# Patient Record
Sex: Female | Born: 1965 | Race: Black or African American | Hispanic: No | Smoking: Never smoker
Health system: Southern US, Community
[De-identification: ages and names within clinical notes are randomized; demographics above are authoritative.]

## PROBLEM LIST (undated history)

## (undated) DIAGNOSIS — I10 Essential (primary) hypertension: Secondary | ICD-10-CM

## (undated) HISTORY — PX: DILATION AND CURETTAGE OF UTERUS: SHX78

---

## 1997-11-06 ENCOUNTER — Other Ambulatory Visit: Admission: RE | Admit: 1997-11-06 | Discharge: 1997-11-06 | Payer: Self-pay | Admitting: Family Medicine

## 2005-06-15 ENCOUNTER — Ambulatory Visit: Payer: Self-pay | Admitting: Gastroenterology

## 2010-06-04 ENCOUNTER — Ambulatory Visit: Payer: Self-pay | Admitting: Gastroenterology

## 2010-06-17 ENCOUNTER — Ambulatory Visit (HOSPITAL_COMMUNITY)
Admission: RE | Admit: 2010-06-17 | Discharge: 2010-06-17 | Payer: Self-pay | Source: Home / Self Care | Attending: Gastroenterology | Admitting: Gastroenterology

## 2010-06-22 LAB — CBC
HCT: 35.1 % — ABNORMAL LOW (ref 36.0–46.0)
Hemoglobin: 11.8 g/dL — ABNORMAL LOW (ref 12.0–15.0)
MCH: 29.3 pg (ref 26.0–34.0)
MCHC: 33.6 g/dL (ref 30.0–36.0)
MCV: 87.1 fL (ref 78.0–100.0)
Platelets: 208 10*3/uL (ref 150–400)
RBC: 4.03 MIL/uL (ref 3.87–5.11)
RDW: 12.4 % (ref 11.5–15.5)
WBC: 6.4 10*3/uL (ref 4.0–10.5)

## 2010-06-22 LAB — PROTIME-INR
INR: 0.98 (ref 0.00–1.49)
Prothrombin Time: 13.2 seconds (ref 11.6–15.2)

## 2010-06-22 LAB — APTT: aPTT: 31 seconds (ref 24–37)

## 2012-01-09 IMAGING — US US BIOPSY
1 series · 14 of 19 positions shown · non-contrast
Comparison: none

Clinical: Hepatitis C; request is made for random core liver
biopsy.

ULTRASOUND GUIDED RANDOM CORE LIVER BIOPSY
Sedation:  2 mg IV Versed; 75 mcg IV Fentanyl
Total Moderate Sedation Time:  20 minutes.
An ultrasound guided liver biopsy was thoroughly discussed with the
patient and questions were answered.  The benefits, risks,
alternatives, and complications were also discussed.  The patient
understands and wishes to proceed with the procedure.  Written
consent was obtained.
Ultrasound of the liver was performed and an appropriate skin entry
site was determined.  Skin site was marked, prepped with Betadine,
and draped in the usual sterile fashion.  Local anesthesia was
provided with 1% Lidocaine.
A 17 gauge trocar needle was advanced under ultrasound guidance
into the liver (right hepatic lobe).  A total of 3 coaxial 18 gauge
core samples were then obtained through the guide needle. The guide
needle was removed. Post procedure scans were obtained.
Complications:  none

[Series 1: us biopsy · 0.28mm/px · 14 of 19 slices shown]
[im 1/19]
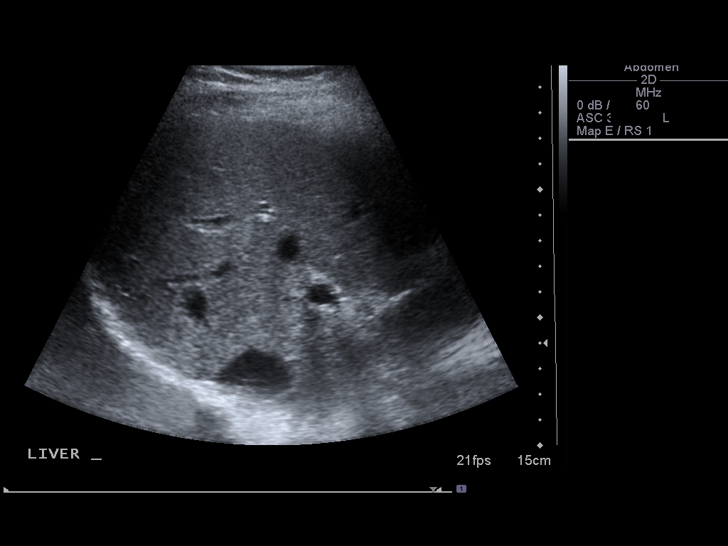
[im 3/19]
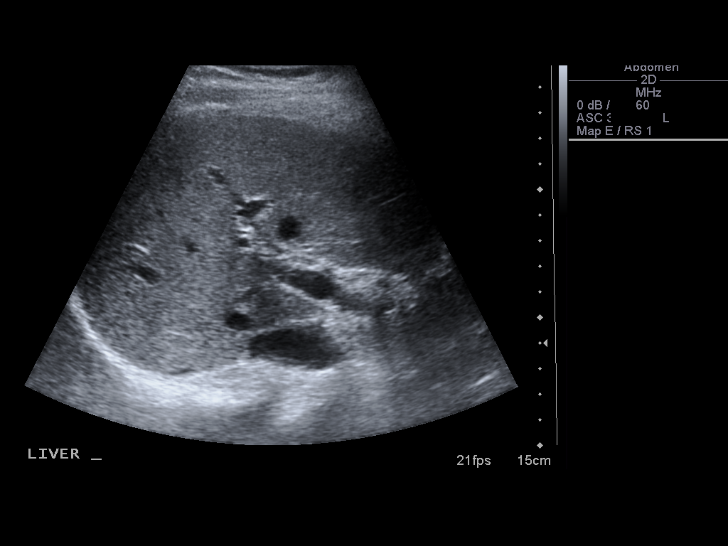
[im 4/19]
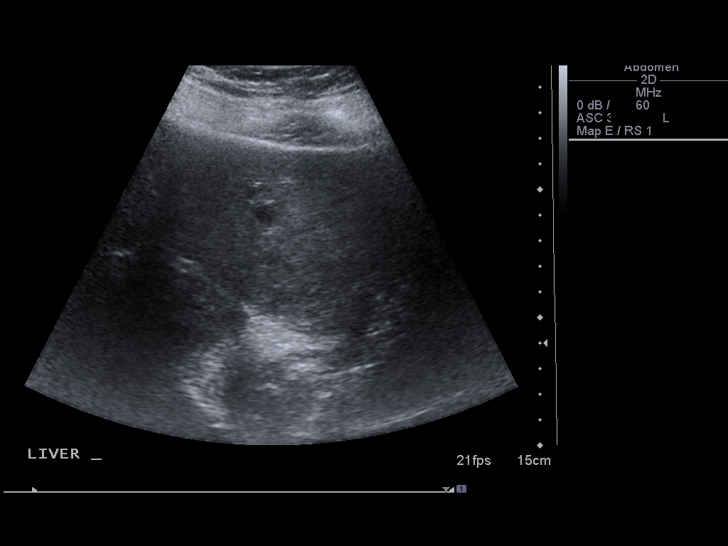
[im 5/19]
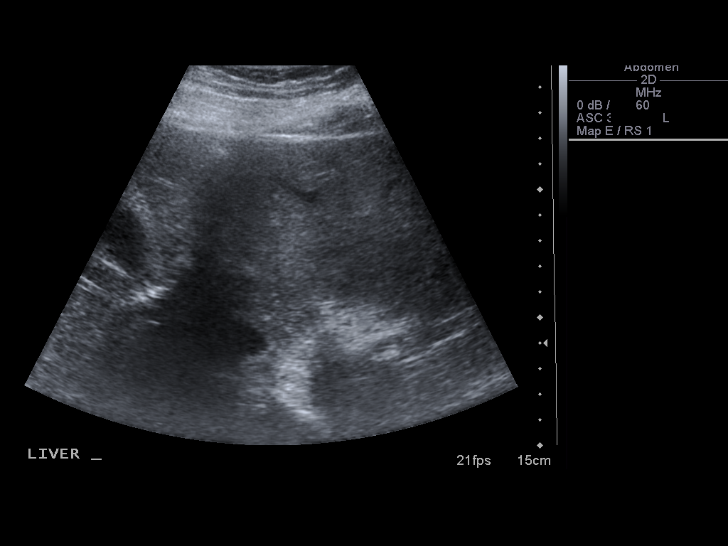
[im 7/19]
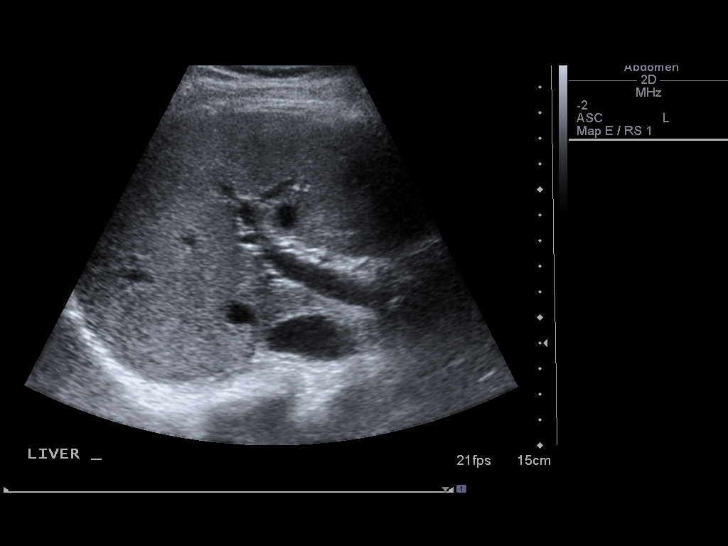
[im 8/19]
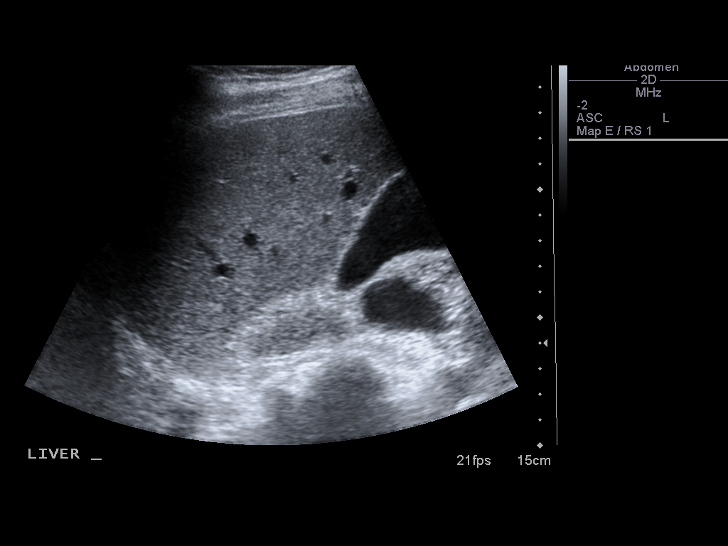
[im 9/19]
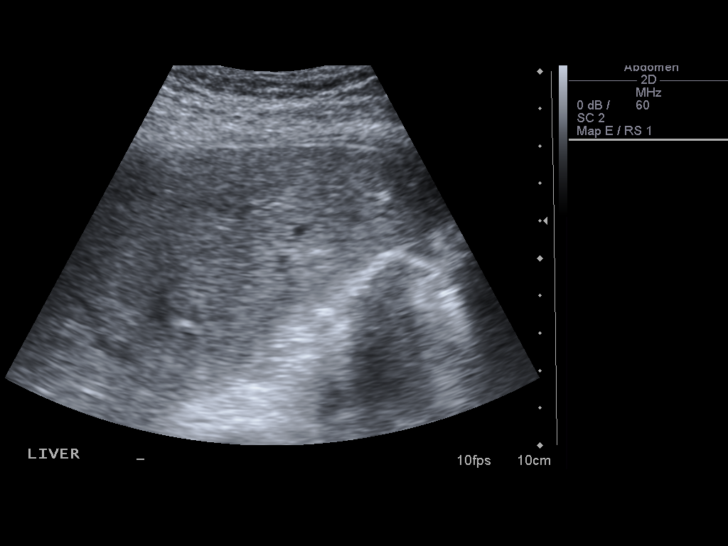
[im 11/19]
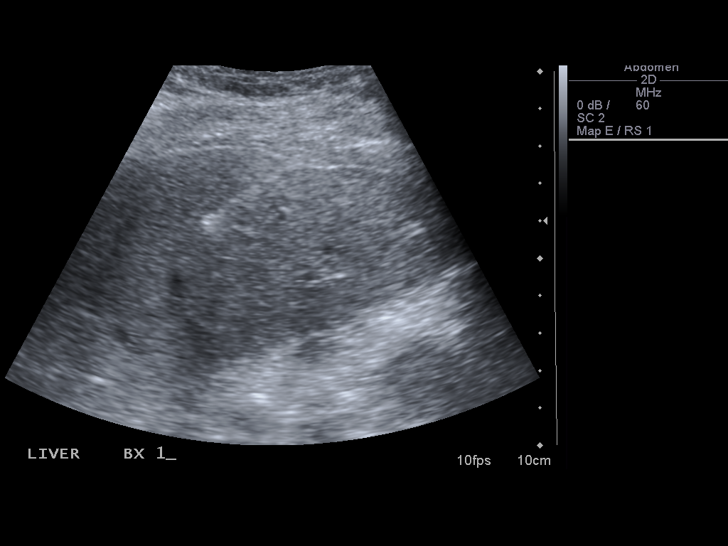
[im 12/19]
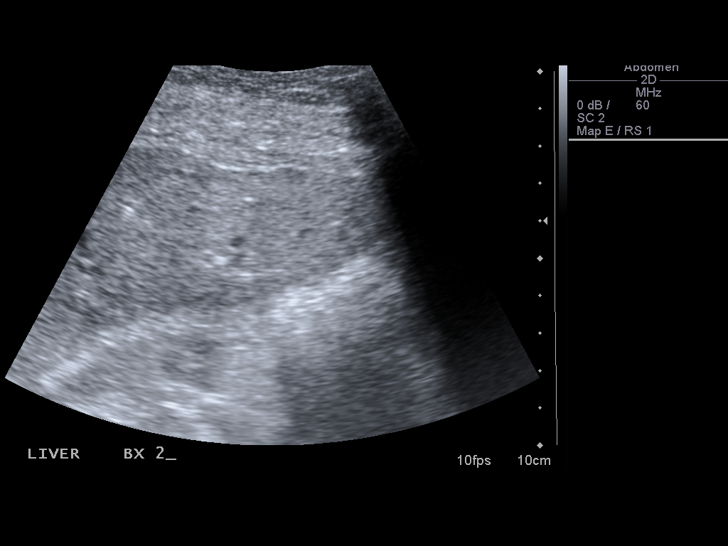
[im 13/19]
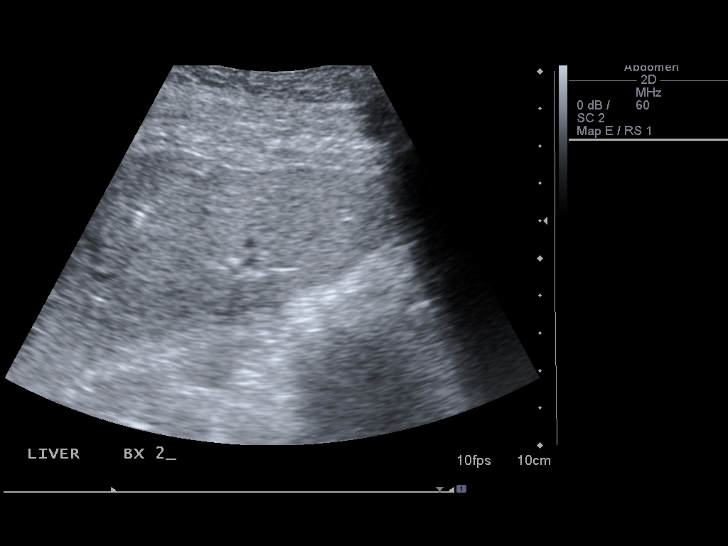
[im 15/19]
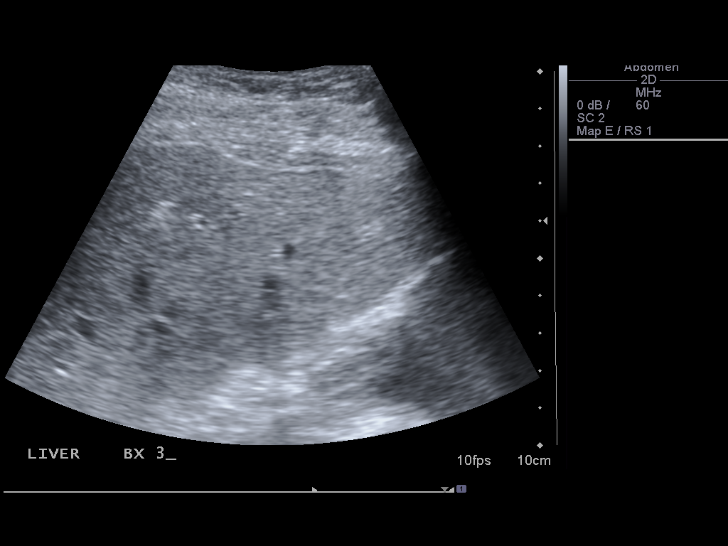
[im 16/19]
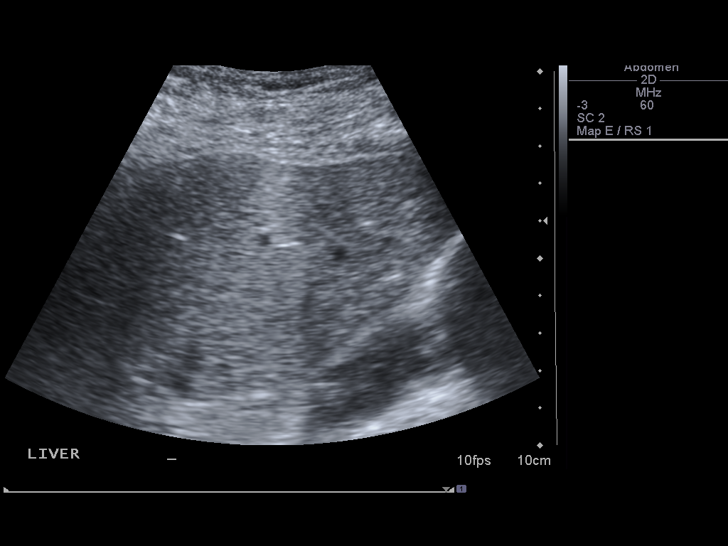
[im 17/19]
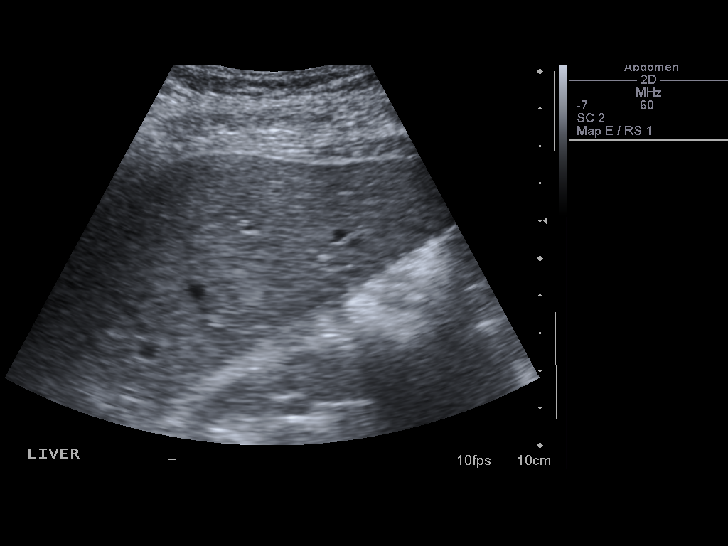
[im 19/19]
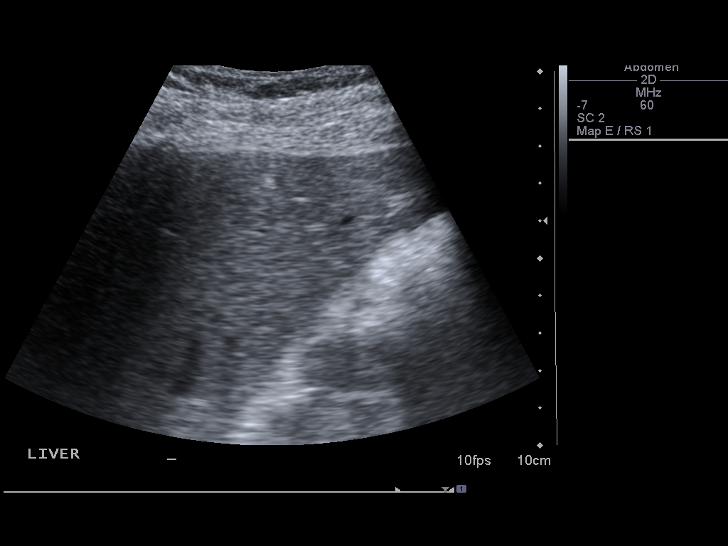

[14 of 19 positions shown; findings below may reference images not displayed]

IMPRESSION: Successful ultrasound guided random core biopsy of the liver.
Final pathology pending.

Read by: Locklear, Ry.-SAVIDAN

## 2018-04-10 ENCOUNTER — Other Ambulatory Visit: Payer: Self-pay | Admitting: Rehabilitation

## 2018-04-10 DIAGNOSIS — M5416 Radiculopathy, lumbar region: Secondary | ICD-10-CM

## 2018-04-25 ENCOUNTER — Ambulatory Visit
Admission: RE | Admit: 2018-04-25 | Discharge: 2018-04-25 | Disposition: A | Discharge status: Home / Self Care | Payer: Medicaid Other | Source: Ambulatory Visit | Attending: Rehabilitation | Admitting: Rehabilitation

## 2018-04-25 DIAGNOSIS — M5416 Radiculopathy, lumbar region: Secondary | ICD-10-CM

## 2018-05-10 ENCOUNTER — Emergency Department (HOSPITAL_COMMUNITY)
Admission: EM | Admit: 2018-05-10 | Discharge: 2018-05-11 | Disposition: A | Discharge status: Home / Self Care | Payer: Medicaid Other | Attending: Emergency Medicine | Admitting: Emergency Medicine

## 2018-05-10 ENCOUNTER — Encounter (HOSPITAL_COMMUNITY): Payer: Self-pay

## 2018-05-10 DIAGNOSIS — H538 Other visual disturbances: Secondary | ICD-10-CM | POA: Diagnosis not present

## 2018-05-10 DIAGNOSIS — R110 Nausea: Secondary | ICD-10-CM | POA: Diagnosis not present

## 2018-05-10 DIAGNOSIS — I10 Essential (primary) hypertension: Secondary | ICD-10-CM | POA: Diagnosis not present

## 2018-05-10 DIAGNOSIS — G44209 Tension-type headache, unspecified, not intractable: Secondary | ICD-10-CM | POA: Diagnosis not present

## 2018-05-10 DIAGNOSIS — R51 Headache: Secondary | ICD-10-CM | POA: Diagnosis present

## 2018-05-10 HISTORY — DX: Essential (primary) hypertension: I10

## 2018-05-10 NOTE — ED Triage Notes (Signed)
Pt states that she has been off BP meds for the past three years, went and saw PCP yesterday and put on amlodipine 5mg , today BP 170 systolic and having a headache.

## 2018-05-11 MED ORDER — DEXAMETHASONE SODIUM PHOSPHATE 10 MG/ML IJ SOLN
10.0000 mg | Freq: Once | INTRAMUSCULAR | Status: AC
  Administered 2018-05-11: 10 mg via INTRAVENOUS
  Filled 2018-05-11: qty 1

## 2018-05-11 MED ORDER — METOCLOPRAMIDE HCL 5 MG/ML IJ SOLN
10.0000 mg | Freq: Once | INTRAMUSCULAR | Status: AC
  Administered 2018-05-11: 10 mg via INTRAVENOUS
  Filled 2018-05-11: qty 2

## 2018-05-11 MED ORDER — LORAZEPAM 2 MG/ML IJ SOLN
1.0000 mg | Freq: Once | INTRAMUSCULAR | Status: AC
  Administered 2018-05-11: 1 mg via INTRAVENOUS
  Filled 2018-05-11: qty 1

## 2018-05-11 MED ORDER — SODIUM CHLORIDE 0.9 % IV BOLUS
1000.0000 mL | Freq: Once | INTRAVENOUS | Status: AC
  Administered 2018-05-11: 1000 mL via INTRAVENOUS

## 2018-05-11 MED ORDER — DIPHENHYDRAMINE HCL 50 MG/ML IJ SOLN
25.0000 mg | Freq: Once | INTRAMUSCULAR | Status: AC
  Administered 2018-05-11: 25 mg via INTRAVENOUS
  Filled 2018-05-11: qty 1

## 2018-05-11 NOTE — Discharge Instructions (Addendum)
Please check your blood pressure once a day and keep a record of it.  Take that record with you when you see your primary care provider.  Please do not get overly concerned about an individual blood pressure reading which is high.  If you are concerned about it, recheck the blood pressure in about 30 minutes.

## 2018-05-11 NOTE — ED Notes (Signed)
Vss, pt verbalized understanding of dc instructions, patient escorted to vehicle via wheelchair, son with patient to drive her home

## 2018-05-11 NOTE — ED Provider Notes (Signed)
[facility] Provider Note   CSN: [id number] Arrival date & time: 05/10/18  2258     History   Chief Complaint Chief Complaint  Patient presents with  . Hypertension    HPI Suzanne Hood is a 52 y.o. female.  The history is provided by the patient.  She has a history of hypertension and comes in because of elevated blood pressure.  She has been off of medication for several years, was seen at her doctor's office yesterday and noted the blood pressure was elevated and started back on amlodipine 5 mg daily.  She has also had a right-sided headache for about the last week.  Headache is dull and throbbing and she rates it at 10/10.  There is associated blurring of vision and nausea but no vomiting.  Headache is worse with bright light, loud noise and also with yawning.  She is taken acetaminophen with little relief.  Tonight, she took her blood pressure at home and it was 170/100 and she was not able to get it down.  She did talk with her PCP who recommended she take an extra dose of the amlodipine, which she did, without any improvement in her blood pressure.  Past Medical History:  Diagnosis Date  . Hypertension     There are no active problems to display for this patient.   ** The histories are not reviewed yet. Please review them in the "History" navigator section and refresh this SmartLink.   OB History   None      Home Medications    Prior to Admission medications   Not on File    Family History No family history on file.  Social History Social History   Tobacco Use  . Smoking status: Not on file  Substance Use Topics  . Alcohol use: Not on file  . Drug use: Not on file     Allergies   Flagyl [metronidazole]; Penicillins; and Sulfa antibiotics   Review of Systems Review of Systems  All other systems reviewed and are negative.    Physical Exam Updated Vital Signs BP (!) 145/98 (BP Location: Right Arm)    Pulse 69   Temp 97.8 F (36.6 C) (Oral)   Resp 19   SpO2 99%   Physical Exam  Nursing note and vitals reviewed.  52 year old female, resting comfortably and in no acute distress. Vital signs are significant for elevated blood pressure. Oxygen saturation is 99%, which is normal. Head is normocephalic and atraumatic. PERRLA, EOMI. Oropharynx is clear. Neck is nontender and supple without adenopathy or JVD.  Fundi show no hemorrhage, exudate, or papilledema.  There is tenderness to palpation over the right temporalis muscle and over the insertion of the paracervical muscles on the right. Back is nontender and there is no CVA tenderness. Lungs are clear without rales, wheezes, or rhonchi. Chest is nontender. Heart has regular rate and rhythm without murmur. Abdomen is soft, flat, nontender without masses or hepatosplenomegaly and peristalsis is normoactive. Extremities have no cyanosis or edema, full range of motion is present. Skin is warm and dry without rash. Neurologic: Mental status is normal, cranial nerves are intact, there are no motor or sensory deficits.  ED Treatments / Results   EKG EKG Interpretation  Date/Time:  Thursday May 11 2018 00:05:24 EST Ventricular Rate:  75 PR Interval:  166 QRS Duration: 86 QT Interval:  376 QTC Calculation: 419 R Axis:   19 Text Interpretation:  Normal sinus rhythm  Normal ECG No old tracing to compare Confirmed by Dione Booze ([id number]) on 05/11/2018 12:29:19 AM  Procedures Procedures (including critical care time)  Medications Ordered in ED Medications - No data to display   Initial Impression / Assessment and Plan / ED Course  I have reviewed the triage vital signs and the nursing notes.  Elevated blood pressure and patient was recently started back on antihypertensive medication.  She has not given this an adequate amount of time to assess blood pressure response, but blood pressure is not at a critical level in the ED.   Headache which has characteristics and physical finding strongly suggestive of muscle contraction headache.  No red flags to suggest more serious causes of headache.  Old records are reviewed, and she has no relevant past visits.  I have reassured her that her elevated blood pressure is not critical, at least in the short run.  She is given a headache cocktail of normal saline, metoclopramide, diphenhydramine, dexamethasone.  Following above-noted treatment, headache got much improved, but she became very anxious and stated she was getting panicky.  She is given a dose of lorazepam.  Blood pressure has come down to normal range.  She is discharged with instructions to monitor her blood pressure on a daily basis.  Reiterated that blood pressure medications take 1-2 weeks of constant used to exert their effect on blood pressure.  Return precautions discussed.  Final Clinical Impressions(s) / ED Diagnoses   Final diagnoses:  Muscle contraction headache  Essential hypertension    ED Discharge Orders    None       Dione Booze, MD 05/11/18 [phone]

## 2019-04-14 ENCOUNTER — Other Ambulatory Visit (HOSPITAL_COMMUNITY)
Admission: RE | Admit: 2019-04-14 | Discharge: 2019-04-14 | Disposition: A | Discharge status: Home / Self Care | Payer: Medicaid Other | Source: Ambulatory Visit | Attending: Obstetrics and Gynecology | Admitting: Obstetrics and Gynecology

## 2019-04-14 DIAGNOSIS — Z01812 Encounter for preprocedural laboratory examination: Secondary | ICD-10-CM | POA: Insufficient documentation

## 2019-04-14 DIAGNOSIS — Z20828 Contact with and (suspected) exposure to other viral communicable diseases: Secondary | ICD-10-CM | POA: Diagnosis not present

## 2019-04-15 LAB — NOVEL CORONAVIRUS, NAA (HOSP ORDER, SEND-OUT TO REF LAB; TAT 18-24 HRS): SARS-CoV-2, NAA: NOT DETECTED

## 2019-04-17 ENCOUNTER — Other Ambulatory Visit: Payer: Self-pay

## 2019-04-17 ENCOUNTER — Encounter (HOSPITAL_BASED_OUTPATIENT_CLINIC_OR_DEPARTMENT_OTHER): Payer: Self-pay | Admitting: *Deleted

## 2019-04-17 NOTE — Anesthesia Preprocedure Evaluation (Addendum)
Anesthesia Evaluation  Patient identified by MRN, date of birth, ID band Patient awake    Reviewed: Allergy & Precautions, H&P , NPO status , Patient's Chart, lab work & pertinent test results  Airway Mallampati: I  TM Distance: >3 FB Neck ROM: Full    Dental no notable dental hx. (+) Teeth Intact, Partial Upper, Missing,    Pulmonary neg pulmonary ROS,    Pulmonary exam normal breath sounds clear to auscultation       Cardiovascular Exercise Tolerance: Good hypertension, Pt. on medications negative cardio ROS Normal cardiovascular exam Rhythm:Regular Rate:Normal     Neuro/Psych negative neurological ROS  negative psych ROS   GI/Hepatic negative GI ROS, Neg liver ROS,   Endo/Other  negative endocrine ROS  Renal/GU negative Renal ROS  negative genitourinary   Musculoskeletal negative musculoskeletal ROS (+)   Abdominal   Peds negative pediatric ROS (+)  Hematology negative hematology ROS (+)   Anesthesia Other Findings   Reproductive/Obstetrics negative OB ROS                            Anesthesia Physical Anesthesia Plan  ASA: II  Anesthesia Plan: General   Post-op Pain Management:    Induction: Intravenous  PONV Risk Score and Plan: 3 and Ondansetron, Dexamethasone, Treatment may vary due to age or medical condition and Midazolam  Airway Management Planned: Oral ETT and LMA  Additional Equipment:   Intra-op Plan:   Post-operative Plan: Extubation in OR  Informed Consent: I have reviewed the patients History and Physical, chart, labs and discussed the procedure including the risks, benefits and alternatives for the proposed anesthesia with the patient or authorized representative who has indicated his/her understanding and acceptance.       Plan Discussed with:   Anesthesia Plan Comments: (  )        Anesthesia Quick Evaluation

## 2019-04-17 NOTE — H&P (Signed)
Suzanne Hood is an 49 y.B.[id number] female with complaint of irregular vaginal bleeding and vaginal itching. Pt has been on ocps but admits to having stopped and started them intermittently through the year - sometimes not after completing a pack.US showed thickened lining with endometrial polyp. Pt would like to preserve uterus. She also reports vaginal itching intermittently but has been more consistent recently - mostly on her labia. Also concerned color change on her labia. She has tried clobetasol and premarin cream without much relief Pt with history of heart murmur - stable. Also with history of anxiety and migraines - lorazepan prn. Hypertension controlled on meloxicam.   Pertinent Gynecological History: Menses: irregular Bleeding: intermenstrual bleeding Contraception: OCP (estrogen/progesterone)  Intermittently DES exposure: unknown Blood transfusions: none Sexually transmitted diseases: no past history Previous GYN Procedures: Left adnexal mass, ruptured Right ectopic with salpingectomy, rupture right ovarian artery postpartum.   Last mammogram: normal Date: 2015 Last pap: normal Date: 2017 OB History: G8, P4044   Menstrual History: Menarche age: 69 Patient's last menstrual period was 04/09/2019.    Past Medical History:  Diagnosis Date  . Hypertension     Past Surgical History:  Procedure Laterality Date  . DILATION AND CURETTAGE OF UTERUS      History reviewed. No pertinent family history.  Social History:  reports that she has never smoked. She has never used smokeless tobacco. She reports that she does not drink alcohol or use drugs.  Allergies:  Allergies  Allergen Reactions  . Flagyl [Metronidazole]     unknown  . Penicillins     Childhood   . Sulfa Antibiotics Hives    No medications prior to admission.    ROS  Height 5\' 9"  (1.753 m), weight 86.6 kg, last menstrual period 04/09/2019. Physical Exam  No results found for this or any previous  visit (from the past 24 hour(s)).  No results found.  Assessment/Plan: 76KG [id number] with DUB, endometrial polyp and vulvar dryness for hysteroscopy with dilation and curettage with myosure and vulvar biopsy Procedure reviewed and risks and benefits discussed Consent obtained after all questions answered NPO per ERAS To OR when ready Suzanne Hood Suzanne Hood 04/17/2019, 12:42 PM

## 2019-04-17 NOTE — Progress Notes (Signed)
Spoke with patient via telephone for pre op interview. NPO after MN. Patient to take Sprintec with a sip of water AM of surgery. Will need UPT Am of surgery. Arrival time 0630.

## 2019-04-18 ENCOUNTER — Encounter (HOSPITAL_BASED_OUTPATIENT_CLINIC_OR_DEPARTMENT_OTHER): Payer: Self-pay

## 2019-04-18 ENCOUNTER — Ambulatory Visit (HOSPITAL_BASED_OUTPATIENT_CLINIC_OR_DEPARTMENT_OTHER): Payer: Medicaid Other | Admitting: Anesthesiology

## 2019-04-18 ENCOUNTER — Ambulatory Visit (HOSPITAL_BASED_OUTPATIENT_CLINIC_OR_DEPARTMENT_OTHER)
Admission: RE | Admit: 2019-04-18 | Discharge: 2019-04-18 | Disposition: A | Discharge status: Home / Self Care | Payer: Medicaid Other | Attending: Obstetrics and Gynecology | Admitting: Obstetrics and Gynecology

## 2019-04-18 ENCOUNTER — Encounter (HOSPITAL_BASED_OUTPATIENT_CLINIC_OR_DEPARTMENT_OTHER)
Admission: RE | Disposition: A | Discharge status: Home / Self Care | Payer: Self-pay | Source: Home / Self Care | Attending: Obstetrics and Gynecology

## 2019-04-18 DIAGNOSIS — N938 Other specified abnormal uterine and vaginal bleeding: Secondary | ICD-10-CM | POA: Insufficient documentation

## 2019-04-18 DIAGNOSIS — N904 Leukoplakia of vulva: Secondary | ICD-10-CM | POA: Insufficient documentation

## 2019-04-18 DIAGNOSIS — Z888 Allergy status to other drugs, medicaments and biological substances status: Secondary | ICD-10-CM | POA: Insufficient documentation

## 2019-04-18 DIAGNOSIS — Z881 Allergy status to other antibiotic agents status: Secondary | ICD-10-CM | POA: Diagnosis not present

## 2019-04-18 DIAGNOSIS — Z880 Allergy status to penicillin: Secondary | ICD-10-CM | POA: Diagnosis not present

## 2019-04-18 DIAGNOSIS — N939 Abnormal uterine and vaginal bleeding, unspecified: Secondary | ICD-10-CM

## 2019-04-18 DIAGNOSIS — N840 Polyp of corpus uteri: Secondary | ICD-10-CM | POA: Insufficient documentation

## 2019-04-18 DIAGNOSIS — Z882 Allergy status to sulfonamides status: Secondary | ICD-10-CM | POA: Diagnosis not present

## 2019-04-18 DIAGNOSIS — I10 Essential (primary) hypertension: Secondary | ICD-10-CM | POA: Insufficient documentation

## 2019-04-18 HISTORY — PX: DILATATION & CURETTAGE/HYSTEROSCOPY WITH MYOSURE: SHX6511

## 2019-04-18 HISTORY — DX: Polyp of corpus uteri: N84.0

## 2019-04-18 HISTORY — PX: VULVA /PERINEUM BIOPSY: SHX319

## 2019-04-18 HISTORY — DX: Abnormal uterine and vaginal bleeding, unspecified: N93.9

## 2019-04-18 LAB — POCT PREGNANCY, URINE: Preg Test, Ur: NEGATIVE

## 2019-04-18 SURGERY — DILATATION & CURETTAGE/HYSTEROSCOPY WITH MYOSURE
Anesthesia: General | Site: Vulva

## 2019-04-18 MED ORDER — SCOPOLAMINE 1 MG/3DAYS TD PT72
MEDICATED_PATCH | TRANSDERMAL | Status: AC
  Filled 2019-04-18: qty 1

## 2019-04-18 MED ORDER — MEPERIDINE HCL 25 MG/ML IJ SOLN
6.2500 mg | INTRAMUSCULAR | Status: DC | PRN
  Filled 2019-04-18: qty 1

## 2019-04-18 MED ORDER — ONDANSETRON HCL 4 MG/2ML IJ SOLN
4.0000 mg | Freq: Once | INTRAMUSCULAR | Status: DC | PRN
  Filled 2019-04-18: qty 2

## 2019-04-18 MED ORDER — HYDRALAZINE HCL 20 MG/ML IJ SOLN
INTRAMUSCULAR | Status: AC
  Filled 2019-04-18: qty 1

## 2019-04-18 MED ORDER — MIDAZOLAM HCL 2 MG/2ML IJ SOLN
INTRAMUSCULAR | Status: DC | PRN
  Administered 2019-04-18: 1 mg via INTRAVENOUS

## 2019-04-18 MED ORDER — SODIUM CHLORIDE 0.9 % IR SOLN
Status: DC | PRN
  Administered 2019-04-18: 3000 mL

## 2019-04-18 MED ORDER — FENTANYL CITRATE (PF) 100 MCG/2ML IJ SOLN
INTRAMUSCULAR | Status: DC | PRN
  Administered 2019-04-18: 50 ug via INTRAVENOUS

## 2019-04-18 MED ORDER — HYDRALAZINE HCL 20 MG/ML IJ SOLN
10.0000 mg | Freq: Once | INTRAMUSCULAR | Status: AC
  Administered 2019-04-18: 10 mg via INTRAVENOUS
  Filled 2019-04-18: qty 0.5

## 2019-04-18 MED ORDER — OXYCODONE HCL 5 MG/5ML PO SOLN
5.0000 mg | Freq: Once | ORAL | Status: DC | PRN
  Filled 2019-04-18: qty 5

## 2019-04-18 MED ORDER — OXYCODONE HCL 5 MG PO TABS
5.0000 mg | ORAL_TABLET | Freq: Once | ORAL | Status: DC | PRN
  Filled 2019-04-18: qty 1

## 2019-04-18 MED ORDER — ARTIFICIAL TEARS OPHTHALMIC OINT
TOPICAL_OINTMENT | OPHTHALMIC | Status: AC
  Filled 2019-04-18: qty 3.5

## 2019-04-18 MED ORDER — PROPOFOL 10 MG/ML IV BOLUS
INTRAVENOUS | Status: DC | PRN
  Administered 2019-04-18: 200 mg via INTRAVENOUS

## 2019-04-18 MED ORDER — IBUPROFEN 600 MG PO TABS: 600.0000 mg | ORAL_TABLET | Freq: Four times a day (QID) | ORAL | 0 refills | Status: AC | PRN

## 2019-04-18 MED ORDER — KETOROLAC TROMETHAMINE 30 MG/ML IJ SOLN
INTRAMUSCULAR | Status: DC | PRN
  Administered 2019-04-18: 30 mg via INTRAVENOUS

## 2019-04-18 MED ORDER — KETOROLAC TROMETHAMINE 30 MG/ML IJ SOLN
INTRAMUSCULAR | Status: AC
  Filled 2019-04-18: qty 1

## 2019-04-18 MED ORDER — ACETAMINOPHEN 500 MG PO TABS
ORAL_TABLET | ORAL | Status: AC
  Filled 2019-04-18: qty 2

## 2019-04-18 MED ORDER — WHITE PETROLATUM EX OINT
TOPICAL_OINTMENT | CUTANEOUS | Status: AC
  Filled 2019-04-18: qty 5

## 2019-04-18 MED ORDER — ACETAMINOPHEN 325 MG PO TABS
325.0000 mg | ORAL_TABLET | ORAL | Status: DC | PRN
  Filled 2019-04-18: qty 2

## 2019-04-18 MED ORDER — PROPOFOL 10 MG/ML IV BOLUS
INTRAVENOUS | Status: AC
  Filled 2019-04-18: qty 40

## 2019-04-18 MED ORDER — LIDOCAINE HCL 1 % IJ SOLN
INTRAMUSCULAR | Status: DC | PRN
  Administered 2019-04-18: 17 mL

## 2019-04-18 MED ORDER — LIDOCAINE 2% (20 MG/ML) 5 ML SYRINGE
INTRAMUSCULAR | Status: AC
  Filled 2019-04-18: qty 5

## 2019-04-18 MED ORDER — ONDANSETRON HCL 4 MG/2ML IJ SOLN
INTRAMUSCULAR | Status: DC | PRN
  Administered 2019-04-18: 4 mg via INTRAVENOUS

## 2019-04-18 MED ORDER — SCOPOLAMINE 1 MG/3DAYS TD PT72
MEDICATED_PATCH | TRANSDERMAL | Status: DC | PRN
  Administered 2019-04-18: 1 via TRANSDERMAL

## 2019-04-18 MED ORDER — OXYCODONE-ACETAMINOPHEN 5-325 MG PO TABS: 1.0000 | ORAL_TABLET | ORAL | 0 refills | Status: AC | PRN

## 2019-04-18 MED ORDER — MIDAZOLAM HCL 2 MG/2ML IJ SOLN
INTRAMUSCULAR | Status: AC
  Filled 2019-04-18: qty 2

## 2019-04-18 MED ORDER — LIDOCAINE 2% (20 MG/ML) 5 ML SYRINGE
INTRAMUSCULAR | Status: DC | PRN
  Administered 2019-04-18: 60 mg via INTRAVENOUS

## 2019-04-18 MED ORDER — LACTATED RINGERS IV SOLN
INTRAVENOUS | Status: DC
  Filled 2019-04-18: qty 1000

## 2019-04-18 MED ORDER — LACTATED RINGERS IV SOLN
INTRAVENOUS | Status: DC
  Administered 2019-04-18: 08:00:00 via INTRAVENOUS
  Filled 2019-04-18: qty 1000

## 2019-04-18 MED ORDER — KETOROLAC TROMETHAMINE 30 MG/ML IJ SOLN
30.0000 mg | Freq: Once | INTRAMUSCULAR | Status: DC
  Filled 2019-04-18: qty 1

## 2019-04-18 MED ORDER — ONDANSETRON HCL 4 MG/2ML IJ SOLN
INTRAMUSCULAR | Status: AC
  Filled 2019-04-18: qty 2

## 2019-04-18 MED ORDER — FENTANYL CITRATE (PF) 100 MCG/2ML IJ SOLN
INTRAMUSCULAR | Status: AC
  Filled 2019-04-18: qty 2

## 2019-04-18 MED ORDER — ACETAMINOPHEN 500 MG PO TABS
1000.0000 mg | ORAL_TABLET | ORAL | Status: AC
  Administered 2019-04-18: 1000 mg via ORAL
  Filled 2019-04-18: qty 2

## 2019-04-18 MED ORDER — DEXAMETHASONE SODIUM PHOSPHATE 10 MG/ML IJ SOLN
INTRAMUSCULAR | Status: DC | PRN
  Administered 2019-04-18: 5 mg via INTRAVENOUS

## 2019-04-18 MED ORDER — ACETAMINOPHEN 160 MG/5ML PO SOLN
325.0000 mg | ORAL | Status: DC | PRN
  Filled 2019-04-18: qty 20.3

## 2019-04-18 MED ORDER — FENTANYL CITRATE (PF) 100 MCG/2ML IJ SOLN
25.0000 ug | INTRAMUSCULAR | Status: DC | PRN
  Filled 2019-04-18: qty 1

## 2019-04-18 MED ORDER — DEXAMETHASONE SODIUM PHOSPHATE 10 MG/ML IJ SOLN
INTRAMUSCULAR | Status: AC
  Filled 2019-04-18: qty 1

## 2019-04-18 SURGICAL SUPPLY — 36 items
BLADE SURG 11 STRL SS (BLADE) ×4 IMPLANT
CANISTER SUCT 3000ML PPV (MISCELLANEOUS) ×4 IMPLANT
CATH ROBINSON RED A/P 16FR (CATHETERS) ×4 IMPLANT
COUNTER NEEDLE 1200 MAGNETIC (NEEDLE) ×4 IMPLANT
COVER WAND RF STERILE (DRAPES) ×4 IMPLANT
DEVICE MYOSURE LITE (MISCELLANEOUS) ×2 IMPLANT
DEVICE MYOSURE REACH (MISCELLANEOUS) IMPLANT
DILATOR CANAL MILEX (MISCELLANEOUS) ×2 IMPLANT
GAUZE 4X4 16PLY RFD (DISPOSABLE) ×4 IMPLANT
GLOVE BIO SURGEON STRL SZ 6.5 (GLOVE) ×3 IMPLANT
GLOVE BIO SURGEONS STRL SZ 6.5 (GLOVE) ×1
GOWN STRL REUS W/TWL LRG LVL3 (GOWN DISPOSABLE) ×8 IMPLANT
IV NS IRRIG 3000ML ARTHROMATIC (IV SOLUTION) ×8 IMPLANT
KIT PROCEDURE FLUENT (KITS) ×4 IMPLANT
KIT TURNOVER CYSTO (KITS) ×4 IMPLANT
MYOSURE XL FIBROID (MISCELLANEOUS)
NDL SPNL 18GX3.5 QUINCKE PK (NEEDLE) IMPLANT
NEEDLE HYPO 22GX1.5 SAFETY (NEEDLE) ×4 IMPLANT
NEEDLE SPNL 18GX3.5 QUINCKE PK (NEEDLE) IMPLANT
NS IRRIG 1000ML POUR BTL (IV SOLUTION) ×4 IMPLANT
PACK VAGINAL MINOR WOMEN LF (CUSTOM PROCEDURE TRAY) ×4 IMPLANT
PACK VAGINAL WOMENS (CUSTOM PROCEDURE TRAY) ×4 IMPLANT
PAD OB MATERNITY 4.3X12.25 (PERSONAL CARE ITEMS) ×4 IMPLANT
PAD PREP 24X48 CUFFED NSTRL (MISCELLANEOUS) ×4 IMPLANT
PUNCH BIOPSY DERMAL 4MM (INSTRUMENTS) ×2 IMPLANT
SEAL CERVICAL OMNI LOK (ABLATOR) IMPLANT
SEAL ROD LENS SCOPE MYOSURE (ABLATOR) ×4 IMPLANT
SUT VIC AB 3-0 CT1 27 (SUTURE)
SUT VIC AB 3-0 CT1 TAPERPNT 27 (SUTURE) ×6 IMPLANT
SUT VIC AB 4-0 SH 27 (SUTURE) ×4
SUT VIC AB 4-0 SH 27XANBCTRL (SUTURE) IMPLANT
SWAB COLLECTION DEVICE MRSA (MISCELLANEOUS) IMPLANT
SWAB CULTURE ESWAB REG 1ML (MISCELLANEOUS) IMPLANT
SYSTEM TISS REMOVAL MYOSURE XL (MISCELLANEOUS) IMPLANT
TOWEL OR 17X26 10 PK STRL BLUE (TOWEL DISPOSABLE) ×6 IMPLANT
WATER STERILE IRR 500ML POUR (IV SOLUTION) ×2 IMPLANT

## 2019-04-18 NOTE — Discharge Instructions (Signed)
Post Anesthesia Home Care Instructions  Activity: Get plenty of rest for the remainder of the day. A responsible adult should stay with you for 24 hours following the procedure.  For the next 24 hours, DO NOT: -Drive a car -Advertising copywriter -Drink alcoholic beverages -Take any medication unless instructed by your physician -Make any legal decisions or sign important papers.  Meals: Start with liquid foods such as gelatin or soup. Progress to regular foods as tolerated. Avoid greasy, spicy, heavy foods. If nausea and/or vomiting occur, drink only clear liquids until the nausea and/or vomiting subsides. Call your physician if vomiting continues.  Special Instructions/Symptoms: Your throat may feel dry or sore from the anesthesia or the breathing tube placed in your throat during surgery. If this causes discomfort, gargle with warm salt water. The discomfort should disappear within 24 hours.  If you had a scopolamine patch placed behind your ear for the management of post- operative nausea and/or vomiting:  1. The medication in the patch is effective for 72 hours, after which it should be removed.  Wrap patch in a tissue and discard in the trash. Wash hands thoroughly with soap and water. 2. You may remove the patch earlier than 72 hours if you experience unpleasant side effects which may include dry mouth, dizziness or visual disturbances. 3. Avoid touching the patch. Wash your hands with soap and water after contact with the patch.    Post Anesthesia Home Care Instructions  Activity: Get plenty of rest for the remainder of the day. A responsible adult should stay with you for 24 hours following the procedure.  For the next 24 hours, DO NOT: -Drive a car -Advertising copywriter -Drink alcoholic beverages -Take any medication unless instructed by your physician -Make any legal decisions or sign important papers.  Meals: Start with liquid foods such as gelatin or soup. Progress to  regular foods as tolerated. Avoid greasy, spicy, heavy foods. If nausea and/or vomiting occur, drink only clear liquids until the nausea and/or vomiting subsides. Call your physician if vomiting continues.  Special Instructions/Symptoms: Your throat may feel dry or sore from the anesthesia or the breathing tube placed in your throat during surgery. If this causes discomfort, gargle with warm salt water. The discomfort should disappear within 24 hours.  If you had a scopolamine patch placed behind your ear for the management of post- operative nausea and/or vomiting:  1. The medication in the patch is effective for 72 hours, after which it should be removed.  Wrap patch in a tissue and discard in the trash. Wash hands thoroughly with soap and water. 2. You may remove the patch earlier than 72 hours if you experience unpleasant side effects which may include dry mouth, dizziness or visual disturbances. 3. Avoid touching the patch. Wash your hands with soap and water after contact with the patch.   DISCHARGE INSTRUCTIONS: D&C / D&E The following instructions have been prepared to help you care for yourself upon your return home.   Personal hygiene:  Use sanitary pads for vaginal drainage, not tampons.  Shower the day after your procedure.  NO tub baths, pools or Jacuzzis for 2-3 weeks.  Wipe front to back after using the bathroom.  Activity and limitations:  Do NOT drive or operate any equipment for 24 hours. The effects of anesthesia are still present and drowsiness may result.  Do NOT rest in bed all day.  Walking is encouraged.  Walk up and down stairs slowly.  You may resume your  normal activity in one to two days or as indicated by your physician.  Sexual activity: NO intercourse for at least 2 weeks after the procedure, or as indicated by your physician.  Diet: Eat a light meal as desired this evening. You may resume your usual diet tomorrow.  Return to work: You may resume  your work activities in one to two days or as indicated by your doctor.  What to expect after your surgery: Expect to have vaginal bleeding/discharge for 2-3 days and spotting for up to 10 days. It is not unusual to have soreness for up to 1-2 weeks. You may have a slight burning sensation when you urinate for the first day. Mild cramps may continue for a couple of days. You may have a regular period in 2-6 weeks.  Call your doctor for any of the following:  Excessive vaginal bleeding, saturating and changing one pad every hour.  Inability to urinate 6 hours after discharge from hospital.  Pain not relieved by pain medication.  Fever of 100.4 F or greater.  Unusual vaginal discharge or odor.   Call for an appointment:     Call office with any concerns [phone]

## 2019-04-18 NOTE — Anesthesia Procedure Notes (Signed)
Procedure Name: LMA Insertion Date/Time: 04/18/2019 8:32 AM Performed by: Wanita Chamberlain, CRNA Pre-anesthesia Checklist: Patient identified, Timeout performed, Emergency Drugs available, Suction available and Patient being monitored Patient Re-evaluated:Patient Re-evaluated prior to induction Oxygen Delivery Method: Circle system utilized Preoxygenation: Pre-oxygenation with 100% oxygen Induction Type: IV induction Ventilation: Mask ventilation without difficulty LMA: LMA inserted LMA Size: 4.0 Number of attempts: 1 Airway Equipment and Method: Bite block

## 2019-04-18 NOTE — Anesthesia Postprocedure Evaluation (Signed)
Anesthesia Post Note  Patient: Suzanne Hood  Procedure(s) Performed: DILATATION & CURETTAGE/HYSTEROSCOPY WITH MYOSURE (N/A Vagina ) VULVAR BIOPSY (N/A Vulva)     Patient location during evaluation: PACU Anesthesia Type: General Level of consciousness: awake and alert Pain management: pain level controlled Vital Signs Assessment: post-procedure vital signs reviewed and stable Respiratory status: spontaneous breathing, nonlabored ventilation, respiratory function stable and patient connected to nasal cannula oxygen Cardiovascular status: blood pressure returned to baseline and stable Postop Assessment: no apparent nausea or vomiting Anesthetic complications: no    Last Vitals:  Vitals:   04/18/19 1027 04/18/19 1115  BP: (!) 151/92 (!) 141/91  Pulse: 60 79  Resp: 20 17  Temp: (!) 36.3 C 36.8 C  SpO2: 100% 99%    Last Pain:  Vitals:   04/18/19 1115  TempSrc:   PainSc: 0-No pain                 Miabella Shannahan

## 2019-04-18 NOTE — Transfer of Care (Signed)
Immediate Anesthesia Transfer of Care Note  Patient: Suzanne Hood  Procedure(s) Performed: DILATATION & CURETTAGE/HYSTEROSCOPY WITH MYOSURE (N/A Vagina ) VULVAR BIOPSY (N/A Vulva)  Patient Location: PACU  Anesthesia Type:General  Level of Consciousness: awake, alert , oriented and patient cooperative  Airway & Oxygen Therapy: Patient Spontanous Breathing and Patient connected to nasal cannula oxygen  Post-op Assessment: Report given to RN and Post -op Vital signs reviewed and stable  Post vital signs: Reviewed and stable  Last Vitals:  Vitals Value Taken Time  BP 158/99 04/18/19 0923  Temp 36.8 C 04/18/19 0924  Pulse 68 04/18/19 0924  Resp 17 04/18/19 0924  SpO2 100 % 04/18/19 0924  Vitals shown include unvalidated device data.  Last Pain:  Vitals:   04/18/19 0719  TempSrc: Oral  PainSc: 0-No pain      Patients Stated Pain Goal: 5 (75/91/63 8466)  Complications: No apparent anesthesia complications

## 2019-04-18 NOTE — Interval H&P Note (Signed)
History and Physical Interval Note: Pt seen. No change since H/P done. Did not take her ocp today: started new pack three days ago To OR when ready  04/18/2019 8:21 AM  [name]  has presented today for surgery, with the diagnosis of abnormal uterine bleeding, polyp of uteri.  The various methods of treatment have been discussed with the patient and family. After consideration of risks, benefits and other options for treatment, the patient has consented to  Procedure(s): DILATATION & CURETTAGE/HYSTEROSCOPY WITH MYOSURE (N/A) VULVAR BIOPSY (N/A) as a surgical intervention.  The patient's history has been reviewed, patient examined, no change in status, stable for surgery.  I have reviewed the patient's chart and labs.  Questions were answered to the patient's satisfaction.     Suzanne Hood

## 2019-04-18 NOTE — Op Note (Signed)
Operative Note    Preoperative Diagnosis: Dysfunctional uterine bleeding                                             Endometrial polyp                                             Vulvar dryness/itching   Postoperative Diagnosis: Same   Procedure: Hysteroscopy with dilation and curettage and myosure                      Vulvar biopsy ( right labia)   Surgeon: Mickle Mallory DO  Anesthesia: General   Fluids: LR 775ml EBL: <37ml UOP: 38ml Fluid Deficit : 164ml  Findings: Endometrial polyp, proliferative appearing endometrium, Grossly normal uterus otherwise. Thin labia consistent with age   Specimen: Endometrial polyp and curetting to pathology                     Vulvar biopsy ( right) to pathology   Procedure Note Patient was taken to the operating room where general anesthesia was administered without difficulty. She was then prepped and draped in the normal sterile fashion while in the dorsal lithotomy position. An appropriate time out was performed. A speculum was then placed within the vagina and the anterior lip of the cervix identified. The cervix was injected with approximately 110ml of 1% plain lidocaine at 3 and 9 o'clock. The  Cervix was then dilated with os finders due to stenosis and finally the uterus was sounded to approximately 8cm. Upon entry into the uterine cavity, the left os was immediately easily seen. The left os was behind a polyp.The Myosure operating scope was then introduced and the cavity inspected with findings as noted above. The Myosure lite operating blade was then introduced through the scope and under direct visualization the polyps were removed in entirety. There was no active bleeding at the conclusion of the removal. The operating scope was then removed from the cervix a small curette inserted and a gentle curettage was performed in all 4 quadrants to sample any remaining endometrium. All tissue specimens were handed off to pathology. The tenaculum was then  removed from the cervix with the site noted to be hemostatic. Finally the speculum was removed from the vagina. Next a punch biopsy of the right labia was performed. 1% lidocaine was injected. Silver nitrate sticks were used for hemostasis and a single stitch using 4-0 vicryl on SH placed. and the patient awakened and taken to the recovery room in good condition. All counts normal per nursing x 2

## 2019-04-19 ENCOUNTER — Encounter (HOSPITAL_BASED_OUTPATIENT_CLINIC_OR_DEPARTMENT_OTHER): Payer: Self-pay | Admitting: Obstetrics and Gynecology

## 2019-04-19 LAB — SURGICAL PATHOLOGY

## 2019-11-17 IMAGING — MR MR LUMBAR SPINE W/O CM
4 of 5 series · 25 of 48 positions shown · non-contrast
Comparison: 05/21/2016

CLINICAL DATA: Chronic lumbar pain and buttock pain over the last 2
years. Pain radiates down the left leg.

EXAM:
MRI LUMBAR SPINE WITHOUT CONTRAST
TECHNIQUE: Multiplanar, multisequence MR imaging of the lumbar spine was
performed. No intravenous contrast was administered.

[Series 2: T2 · sagittal · 4.0mm · 0.55mm/px · 5 of 13 slices shown (1 of 2)]
[im 1/13]
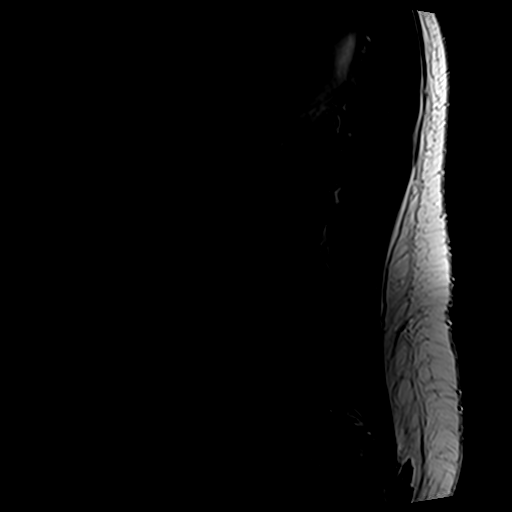
[im 4/13]
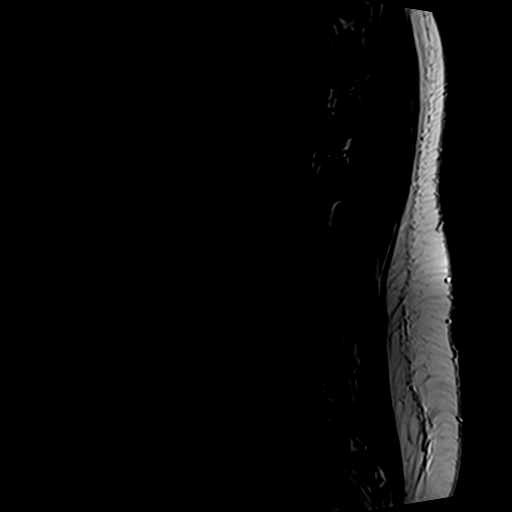
[im 7/13]
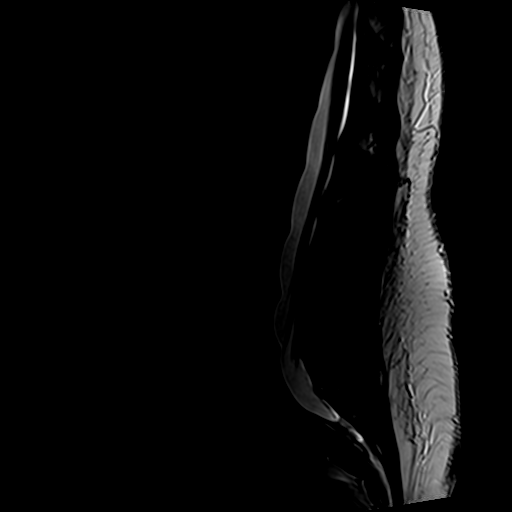
[im 10/13]
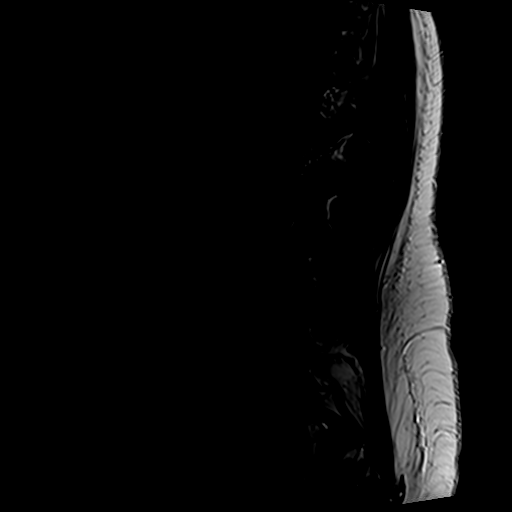
[im 13/13]
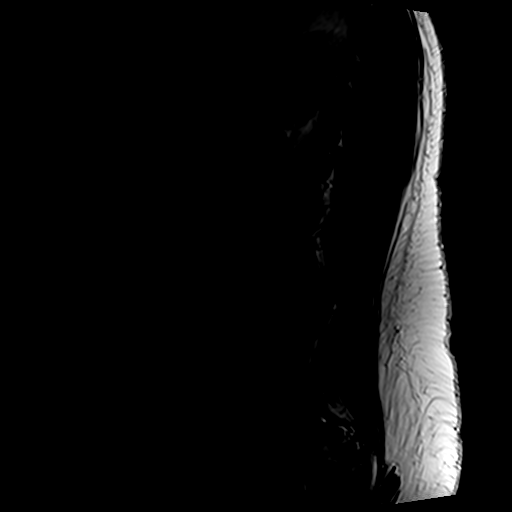

[Series 4: T1 · sagittal · 4.0mm · 0.55mm/px · 6 of 13 slices shown (1 of 2)]
[im 1/13]
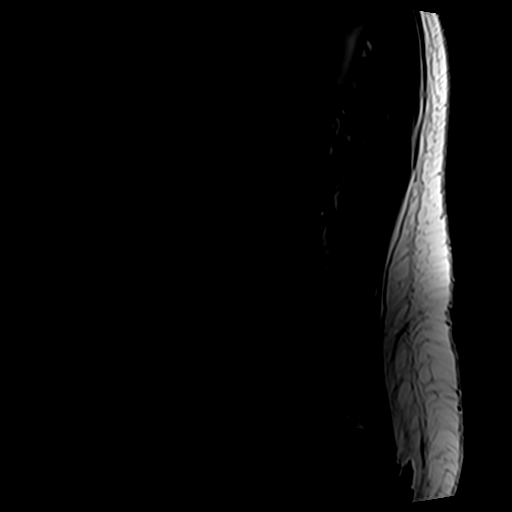
[im 3/13]
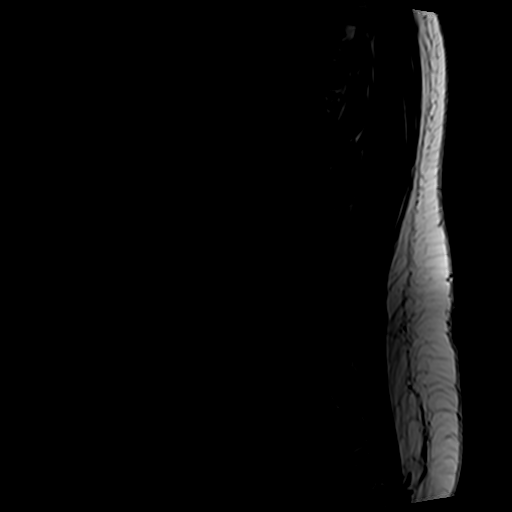
[im 5/13]
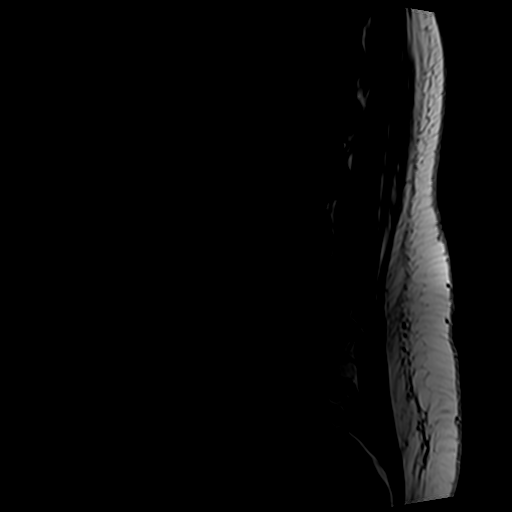
[im 8/13]
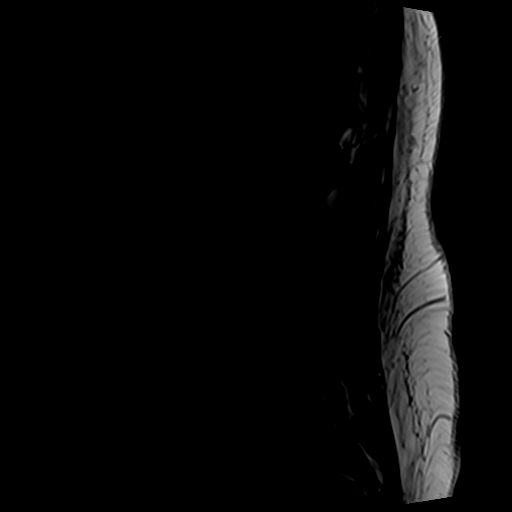
[im 10/13]
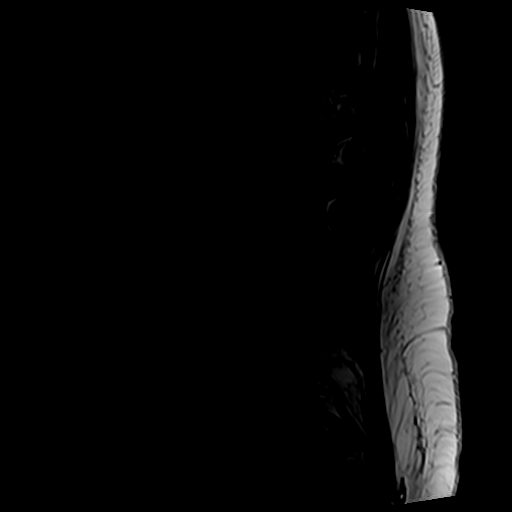
[im 13/13]
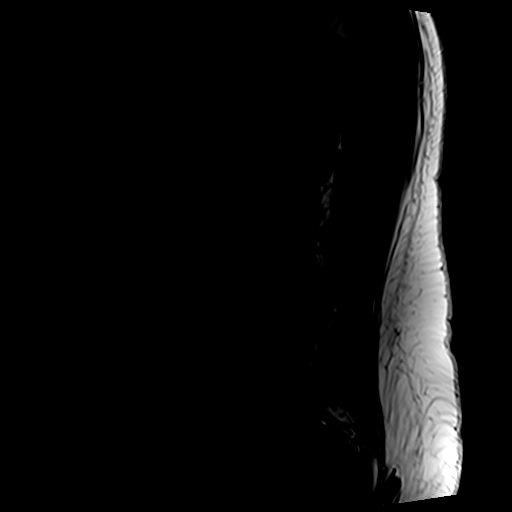

[Series 5: T2 · axial · 4.0mm · 0.70mm/px · z∈[-104,+109]mm · 10 of 36 slices shown (2 of 2)]
[im 3/36]
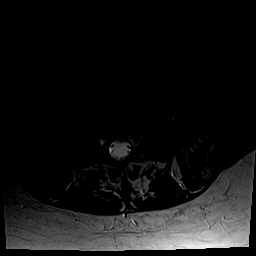
[im 5/36]
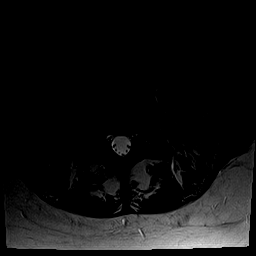
[im 8/36]
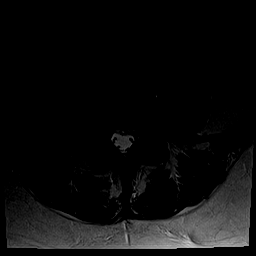
[im 12/36]
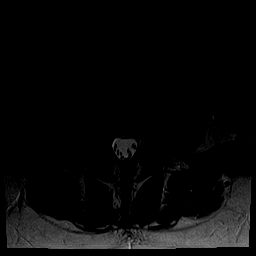
[im 17/36]
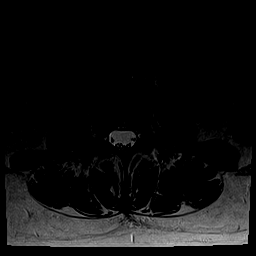
[im 19/36]
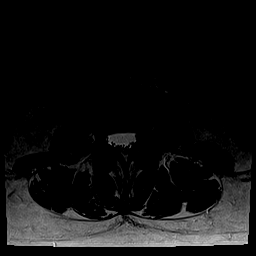
[im 22/36]
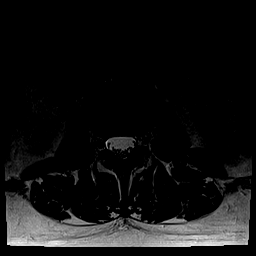
[im 26/36]
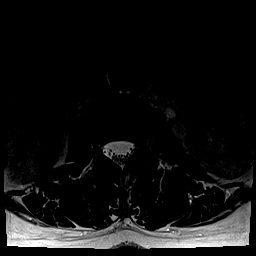
[im 31/36]
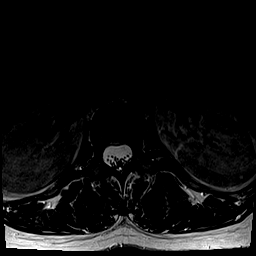
[im 36/36]
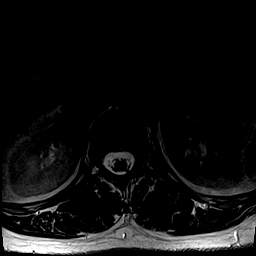

[Series 6: T1 · axial · 4.0mm · 0.35mm/px · z∈[-104,+83]mm · 4 of 36 slices shown (2 of 2)]
[im 3/36]
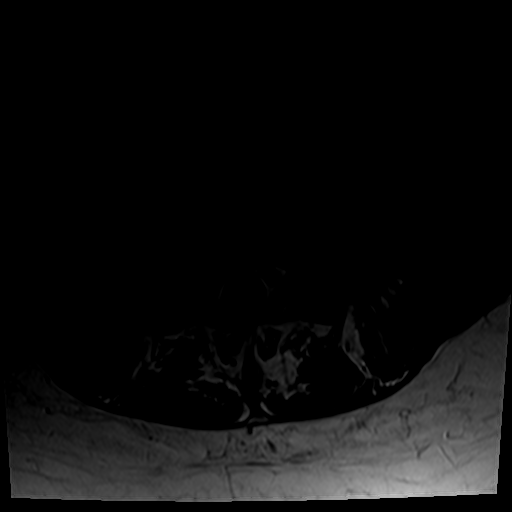
[im 5/36]
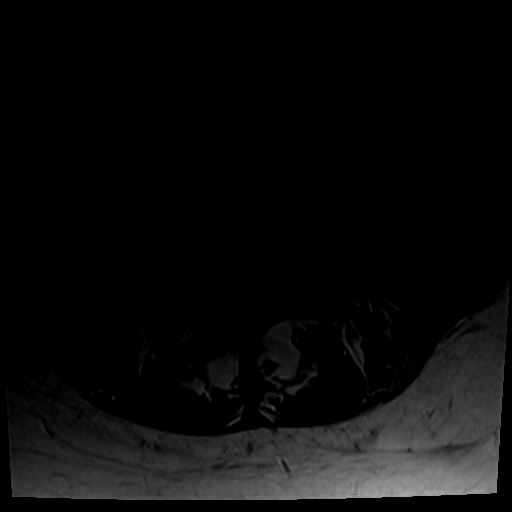
[im 19/36]
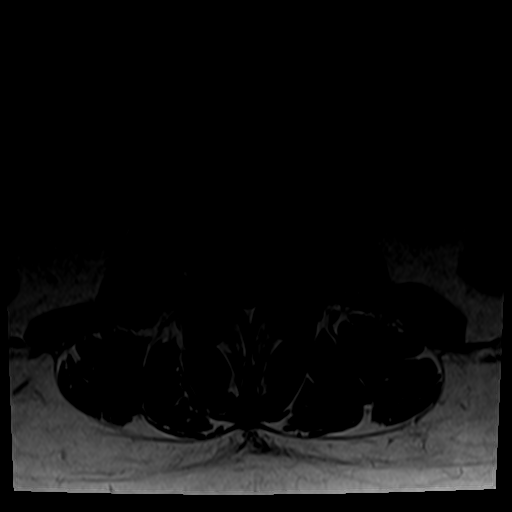
[im 31/36]
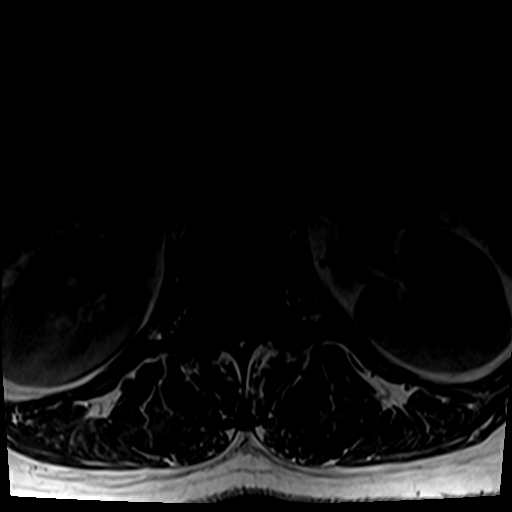

[25 of 48 positions shown; findings below may reference images not displayed]

FINDINGS: Segmentation:  5 lumbar type vertebral bodies.

Alignment:  Normal

Vertebrae: No fracture or significant bone finding. Benign appearing
fat within the L3 vertebral body.

Conus medullaris and cauda equina: Conus extends to the L1 level.
Conus and cauda equina appear normal.

Paraspinal and other soft tissues: Negative

Disc levels:

No significant finding at L2-3 or above.

L3-4: Desiccation and bulging of the disc. No compressive stenosis.
No change since the previous study.

L4-5: Shallow protrusion of the disc. Facet degeneration and
hypertrophy worse on the right than the left. Narrowing of the right
lateral recess and intervertebral foramen on the right that could
cause right-sided neural compression. Mild left lateral recess
narrowing as well.

L5-S1: Involution of the previously seen disc herniation. The disc
is degenerated and bulges in a diffuse fashion, slightly more
towards the left. Mild facet hypertrophy. Mild stenosis of the left
lateral recess without definite neural compression.
IMPRESSION: L3-4: Disc bulge.  No neural compression.  No change.

L4-5: Disc bulge more prominent towards the left. Facet degeneration
and hypertrophy worse on the right. Narrowing of both lateral
recesses and of the intervertebral foramen on the right. Findings at
this level could be symptomatic, and are somewhat worsened since
0292.

L5-S1: Involution of a previously seen left posterolateral disc
herniation. Bulging of the L5-S1 disc more towards the left. Mild
narrowing of the subarticular lateral recess on the left but without
distinct neural compression.

## 2020-03-06 DIAGNOSIS — R079 Chest pain, unspecified: Secondary | ICD-10-CM
